# Patient Record
Sex: Female | Born: 2005 | Race: White | Hispanic: No | Marital: Single | Smoking: Never smoker
Health system: Southern US, Community
[De-identification: ages and names within clinical notes are randomized; demographics above are authoritative.]

## PROBLEM LIST (undated history)

## (undated) DIAGNOSIS — J45909 Unspecified asthma, uncomplicated: Secondary | ICD-10-CM

## (undated) HISTORY — PX: FRACTURE SURGERY: SHX138

---
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
## 2023-02-01 ENCOUNTER — Encounter: Payer: Self-pay | Admitting: *Deleted

## 2023-04-02 ENCOUNTER — Ambulatory Visit
Admission: RE | Admit: 2023-04-02 | Discharge: 2023-04-02 | Disposition: A | Discharge status: Home / Self Care | Payer: 59 | Source: Ambulatory Visit

## 2023-04-02 ENCOUNTER — Ambulatory Visit (INDEPENDENT_AMBULATORY_CARE_PROVIDER_SITE_OTHER): Payer: 59

## 2023-04-02 ENCOUNTER — Other Ambulatory Visit: Payer: Self-pay

## 2023-04-02 VITALS — BP 118/75 | HR 57 | Temp 98.4°F | Resp 16 | Ht 62.0 in | Wt 119.0 lb

## 2023-04-02 DIAGNOSIS — S6992XA Unspecified injury of left wrist, hand and finger(s), initial encounter: Secondary | ICD-10-CM

## 2023-04-02 DIAGNOSIS — M79642 Pain in left hand: Secondary | ICD-10-CM

## 2023-04-02 MED ORDER — IBUPROFEN 800 MG PO TABS: 800.0000 mg | ORAL_TABLET | Freq: Every day | ORAL | 0 refills | Status: AC | PRN

## 2023-04-02 NOTE — ED Triage Notes (Signed)
Patient c/o left hand injury, 3 fingers are now bruised and swollen.  Patient denies any OTC pain meds.  Patient has applied ice to area.

## 2023-04-02 NOTE — ED Provider Notes (Signed)
[facility]    CSN: [id number] Arrival date & time: 04/02/23  1022      History   Chief Complaint Chief Complaint  Patient presents with   Hand Problem    Three injured fingers - Entered by patient    HPI Caitlin Kaufman is a 17 y.o. female.    HPI pleasant 17 year old female presents with left hand injury, reports 3 fingers are now bruised and swollen.  Patient reports applying ice to this area.  Past Medical History:  Diagnosis Date   Asthma     Patient Active Problem List   Diagnosis Date Noted   Sprain of anterior talofibular ligament of right ankle 06/16/2021   Cervical paraspinal muscle spasm 11/28/2019   Hyperextension injury of knee, right, initial encounter 04/30/2019   Postconcussive syndrome 11/21/2018   Chest wall pain 08/11/2018   Chronic pain of left ankle 06/09/2018   Dextroscoliosis 08/11/2017    Past Surgical History:  Procedure Laterality Date   FRACTURE SURGERY     left elbow    OB History   No obstetric history on file.      Home Medications    Prior to Admission medications   Medication Sig Start Date End Date Taking? Authorizing Provider  ibuprofen (ADVIL) 800 MG tablet Take 1 tablet (800 mg total) by mouth daily as needed. 04/02/23  Yes Trevor Iha, FNP  LO LOESTRIN FE 1 MG-10 MCG / 10 MCG tablet Take 1 tablet by mouth daily. 03/10/23  Yes [provider]  albuterol (PROVENTIL) (2.5 MG/3ML) 0.083% nebulizer solution Take 2.5 mg by nebulization every 6 (six) hours as needed for wheezing or shortness of breath.    [provider]  cyclobenzaprine (FLEXERIL) 10 MG tablet One half to one tab PO qHS, then increase gradually to one tab TID. 11/28/19   Monica Becton, MD    Family History History reviewed. No pertinent family history.  Social History Social History   Tobacco Use   Smoking status: Never   Smokeless tobacco: Never  Vaping Use   Vaping Use: Never used  Substance Use Topics    Alcohol use: No    Alcohol/week: 0.0 standard drinks of alcohol   Drug use: No     Allergies   Patient has no known allergies.   Review of Systems Review of Systems   Physical Exam Triage Vital Signs ED Triage Vitals  Enc Vitals Group     BP 04/02/23 1049 118/75     Pulse Rate 04/02/23 1049 (!) 57     Resp 04/02/23 1049 16     Temp 04/02/23 1049 98.4 F (36.9 C)     Temp Source 04/02/23 1049 Oral     SpO2 04/02/23 1049 99 %     Weight 04/02/23 1051 119 lb (54 kg)     Height 04/02/23 1051 5\' 2"  (1.575 m)     Head Circumference --      Peak Flow --      Pain Score 04/02/23 1050 7     Pain Loc --      Pain Edu? --      Excl. in GC? --    No data found.  Updated Vital Signs BP 118/75 (BP Location: Left Arm)   Pulse (!) 57   Temp 98.4 F (36.9 C) (Oral)   Resp 16   Ht 5\' 2"  (1.575 m)   Wt 119 lb (54 kg)   LMP 03/19/2023   SpO2 99%  BMI 21.77 kg/m   Visual Acuity Right Eye Distance:   Left Eye Distance:   Bilateral Distance:    Right Eye Near:   Left Eye Near:    Bilateral Near:     Physical Exam Vitals and nursing note reviewed.  Constitutional:      Appearance: Normal appearance. She is normal weight.  HENT:     Head: Normocephalic and atraumatic.     Mouth/Throat:     Mouth: Mucous membranes are moist.     Pharynx: Oropharynx is clear.  Eyes:     Extraocular Movements: Extraocular movements intact.     Conjunctiva/sclera: Conjunctivae normal.     Pupils: Pupils are equal, round, and reactive to light.  Cardiovascular:     Rate and Rhythm: Normal rate and regular rhythm.     Pulses: Normal pulses.     Heart sounds: Normal heart sounds.  Pulmonary:     Effort: Pulmonary effort is normal.     Breath sounds: Normal breath sounds.  Musculoskeletal:        General: Normal range of motion.     Cervical back: Normal range of motion and neck supple.     Comments: Left hand (dorsum): Soft tissue swelling with mild ecchymosis noted over index,  middle, ring, and little fingers; grip 3/5, neurovascular intact, neurosensory intact  Skin:    General: Skin is warm and dry.  Neurological:     General: No focal deficit present.     Mental Status: She is alert and oriented to person, place, and time. Mental status is at baseline.      UC Treatments / Results  Labs (all labs ordered are listed, but only abnormal results are displayed) Labs Reviewed - No data to display  EKG   Radiology DG Hand Complete Left  Result Date: 04/02/2023 CLINICAL DATA:  left hand injury EXAM: LEFT HAND - COMPLETE 3+ VIEW COMPARISON:  None Available. FINDINGS: No acute fracture or dislocation. Joint spaces and alignment are maintained. No area of erosion or osseous destruction. No unexpected radiopaque foreign body. Mild soft tissue edema of the third, fourth and fifth digit. IMPRESSION: 1. No acute fracture or dislocation. 2. Mild soft tissue edema of the third, fourth and fifth digit. Electronically Signed   By: Meda Klinefelter M.D.   On: 04/02/2023 11:05    Procedures Procedures (including critical care time)  Medications Ordered in UC Medications - No data to display  Initial Impression / Assessment and Plan / UC Course  I have reviewed the triage vital signs and the nursing notes.  Pertinent labs & imaging results that were available during my care of the patient were reviewed by me and considered in my medical decision making (see chart for details).     MDM: 1.  Injury of left hand, initial encounter-left hand x-ray revealed above; 2.  Pain in left hand-Rx'd Ibuprofen daily, as needed. Advised patient of left hand x-ray results with hardcopy and images provided.  Advised patient to RICE affected area of left hand and fingers for 30 minutes 3 times daily for the next 3 days.  Advised may take Ibuprofen 800 mg daily or as needed for left hand/fingers pain.  Encouraged increase daily water intake to 64 ounces per day while taking this  medication.  Advised if symptoms worsen and/or unresolved please follow-up with PCP or here for further evaluation.  Discharged home, hemodynamically stable.   Final Clinical Impressions(s) / UC Diagnoses   Final diagnoses:  Injury of  left hand, initial encounter  Pain in left hand     Discharge Instructions      Advised patient of left hand x-ray results with hardcopy and images provided.  Advised patient to RICE affected area of left hand and fingers for 30 minutes 3 times daily for the next 3 days.  Advised may take Ibuprofen 800 mg daily or as needed for left hand/fingers pain.  Encouraged increase daily water intake to 64 ounces per day while taking this medication.  Advised if symptoms worsen and/or unresolved please follow-up with PCP or here for further evaluation.     ED Prescriptions     Medication Sig Dispense Auth. Provider   ibuprofen (ADVIL) 800 MG tablet Take 1 tablet (800 mg total) by mouth daily as needed. 24 tablet Trevor Iha, FNP      PDMP not reviewed this encounter.   Trevor Iha, FNP 04/02/23 1534

## 2023-04-02 NOTE — Discharge Instructions (Addendum)
Advised patient of left hand x-ray results with hardcopy and images provided.  Advised patient to RICE affected area of left hand and fingers for 30 minutes 3 times daily for the next 3 days.  Advised may take Ibuprofen 800 mg daily or as needed for left hand/fingers pain.  Encouraged increase daily water intake to 64 ounces per day while taking this medication.  Advised if symptoms worsen and/or unresolved please follow-up with PCP or here for further evaluation.

## 2024-04-24 ENCOUNTER — Ambulatory Visit: Payer: Self-pay

## 2024-06-21 ENCOUNTER — Encounter: Payer: Self-pay | Admitting: Sports Medicine
